# Patient Record
Sex: Male | Born: 1995
Health system: Southern US, Community
[De-identification: ages and names within clinical notes are randomized; demographics above are authoritative.]

## PROBLEM LIST (undated history)

## (undated) DIAGNOSIS — J45909 Unspecified asthma, uncomplicated: Secondary | ICD-10-CM

---

## 2016-09-08 ENCOUNTER — Encounter (HOSPITAL_COMMUNITY): Payer: Self-pay

## 2016-09-08 ENCOUNTER — Emergency Department (HOSPITAL_COMMUNITY)
Admission: EM | Admit: 2016-09-08 | Discharge: 2016-09-08 | Disposition: A | Discharge status: Home / Self Care | Payer: Self-pay | Attending: Emergency Medicine | Admitting: Emergency Medicine

## 2016-09-08 ENCOUNTER — Ambulatory Visit: Payer: Self-pay

## 2016-09-08 ENCOUNTER — Emergency Department (HOSPITAL_COMMUNITY): Payer: Self-pay

## 2016-09-08 DIAGNOSIS — J45901 Unspecified asthma with (acute) exacerbation: Secondary | ICD-10-CM | POA: Insufficient documentation

## 2016-09-08 HISTORY — DX: Unspecified asthma, uncomplicated: J45.909

## 2016-09-08 MED ORDER — ALBUTEROL SULFATE (2.5 MG/3ML) 0.083% IN NEBU
5.0000 mg | INHALATION_SOLUTION | Freq: Once | RESPIRATORY_TRACT | Status: AC
  Administered 2016-09-08: 5 mg via RESPIRATORY_TRACT
  Filled 2016-09-08: qty 6

## 2016-09-08 MED ORDER — PREDNISONE 20 MG PO TABS: 40.0000 mg | ORAL_TABLET | Freq: Every day | ORAL | 0 refills | Status: AC

## 2016-09-08 MED ORDER — ALBUTEROL SULFATE HFA 108 (90 BASE) MCG/ACT IN AERS: 1.0000 | INHALATION_SPRAY | Freq: Four times a day (QID) | RESPIRATORY_TRACT | 0 refills | Status: DC | PRN

## 2016-09-08 NOTE — ED Provider Notes (Signed)
[facility]-EMERGENCY DEPT Provider Note   CSN: [id number] Arrival date & time: 09/08/16  1614     History   Chief Complaint Chief Complaint  Patient presents with  . Shortness of Breath    HPI Jeffrey Cowan is a 20 y.o. male.  HPI reports a history of asthma and comes in for evaluation of an acute asthma exacerbation. Patient reports at approximately 2:00 this afternoon he experienced an asthma exacerbation, but was out of his albuterol inhaler. He came to the emergency Department where he received a breathing treatment. He reports his symptoms have resolved at this time. He denies any discomfort whatsoever. No fevers, chills, cough, chest pain, shortness of breath, leg swelling, nausea or vomiting, abdominal pain. Denies any other allergies. No illicit drug use. No other modifying factors.  Past Medical History:  Diagnosis Date  . Asthma     There are no active problems to display for this patient.   History reviewed. No pertinent surgical history.  OB History    No data available       Home Medications    Prior to Admission medications   Medication Sig Start Date End Date Taking? Authorizing Provider  albuterol (PROVENTIL HFA;VENTOLIN HFA) 108 (90 Base) MCG/ACT inhaler Inhale 1-2 puffs into the lungs every 6 (six) hours as needed for wheezing or shortness of breath. 09/08/16   Joycie Peek, PA-C  predniSONE (DELTASONE) 20 MG tablet Take 2 tablets (40 mg total) by mouth daily. 09/08/16   Joycie Peek, PA-C    Family History History reviewed. No pertinent family history.  Social History Social History  Substance Use Topics  . Smoking status: Never Smoker  . Smokeless tobacco: Never Used  . Alcohol use Not on file     Allergies   Review of patient's allergies indicates no known allergies.   Review of Systems Review of Systems A 10 point review of systems was completed and was negative except for pertinent positives and negatives as mentioned in the  history of present illness    Physical Exam Updated Vital Signs BP 100/86 (BP Location: Left Arm)   Pulse 105   Temp 98.4 F (36.9 C) (Oral)   Resp 20   SpO2 94%   Physical Exam  Constitutional: He appears well-developed. No distress.  Awake, alert and nontoxic in appearance  HENT:  Head: Normocephalic and atraumatic.  Right Ear: External ear normal.  Left Ear: External ear normal.  Mouth/Throat: Oropharynx is clear and moist.  Eyes: Conjunctivae and EOM are normal. Pupils are equal, round, and reactive to light.  Neck: Normal range of motion. No JVD present.  Cardiovascular: Normal rate, regular rhythm and normal heart sounds.   Pulmonary/Chest: Effort normal and breath sounds normal. No stridor. No respiratory distress. He has no wheezes. He has no rales.  Abdominal: Soft. There is no tenderness.  Musculoskeletal: Normal range of motion.  Neurological:  Awake, alert, cooperative and aware of situation; motor strength bilaterally; sensation normal to light touch bilaterally; no facial asymmetry; tongue midline; major cranial nerves appear intact;  baseline gait without new ataxia.  Skin: No rash noted. He is not diaphoretic.  Psychiatric: He has a normal mood and affect. His behavior is normal. Thought content normal.  Nursing note and vitals reviewed.    ED Treatments / Results  Labs (all labs ordered are listed, but only abnormal results are displayed) Labs Reviewed - No data to display  EKG  EKG Interpretation  Date/Time:  Wednesday September 08 2016 16:43:37 EDT  Ventricular Rate:  102 PR Interval:    QRS Duration: 83 QT Interval:  313 QTC Calculation: 408 R Axis:   86 Text Interpretation:  Sinus tachycardia Ventricular premature complex Biatrial enlargement ST-t wave abnormality Artifact Abnormal ekg Confirmed by Gerhard MunchLOCKWOOD, ROBERT  MD [phone]([id number]) on 09/08/2016 5:18:21 PM       Radiology Dg Chest 2 View  Result Date: 09/08/2016 CLINICAL DATA:  Shortness of breath  since this morning. EXAM: CHEST  2 VIEW COMPARISON:  None. FINDINGS: The cardiac silhouette, mediastinal and hilar contours are normal. The lungs demonstrate mild hyperinflation but no infiltrates, edema or effusions. The bony thorax is intact. IMPRESSION: Hyperinflation but no infiltrates or effusions. Electronically Signed   By: Rudie MeyerP.  Gallerani M.D.   On: 09/08/2016 17:25    Procedures Procedures (including critical care time)  Medications Ordered in ED Medications  albuterol (PROVENTIL) (2.5 MG/3ML) 0.083% nebulizer solution 5 mg (5 mg Nebulization Given 09/08/16 1642)     Initial Impression / Assessment and Plan / ED Course  I have reviewed the triage vital signs and the nursing notes.  Pertinent labs & imaging results that were available during my care of the patient were reviewed by me and considered in my medical decision making (see chart for details).  Clinical Course      Asthma exacerbation resolved with one breathing treatment in emergency department. Overall he appears very well, nontoxic, hemodynamically stable. We'll discharge with short course steroid burst. Refill albuterol inhaler. Follow up with PCP as needed. Return precautions discussed.  Prior to patient discharge, I discussed and reviewed this case with Dr. Jeraldine LootsLockwood    Final Clinical Impressions(s) / ED Diagnoses   Final diagnoses:  Mild asthma with exacerbation, unspecified whether persistent    New Prescriptions New Prescriptions   ALBUTEROL (PROVENTIL HFA;VENTOLIN HFA) 108 (90 BASE) MCG/ACT INHALER    Inhale 1-2 puffs into the lungs every 6 (six) hours as needed for wheezing or shortness of breath.   PREDNISONE (DELTASONE) 20 MG TABLET    Take 2 tablets (40 mg total) by mouth daily.     Joycie PeekBenjamin Mariangel Ringley, PA-C 09/08/16 1733    Gerhard Munchobert Lockwood, MD 09/08/16 2251

## 2016-09-08 NOTE — ED Triage Notes (Signed)
Pt states that he began to feel short of breath this morning. Pt tried to use his inhaler at home without relief. A&Ox4. No distress noted.

## 2016-09-08 NOTE — ED Notes (Signed)
PT DISCHARGED. INSTRUCTIONS AND PRESCRIPTIONS GIVEN. AAOX4. PT IN NO APPARENT DISTRESS OR PAIN. THE OPPORTUNITY TO ASK QUESTIONS WAS PROVIDED. 

## 2016-09-08 NOTE — Discharge Instructions (Signed)
Please take your medications as prescribed. Follow-up with your doctor as needed. Use your albuterol inhaler for any new asthma exacerbation. Return to ED for new or worsening symptoms as we discussed.

## 2017-09-12 IMAGING — CR DG CHEST 2V
2 series · 2 of 2 positions shown · non-contrast
Comparison: None.

CLINICAL DATA: Shortness of breath since this morning.

EXAM:
CHEST  2 VIEW

[w chest pa]
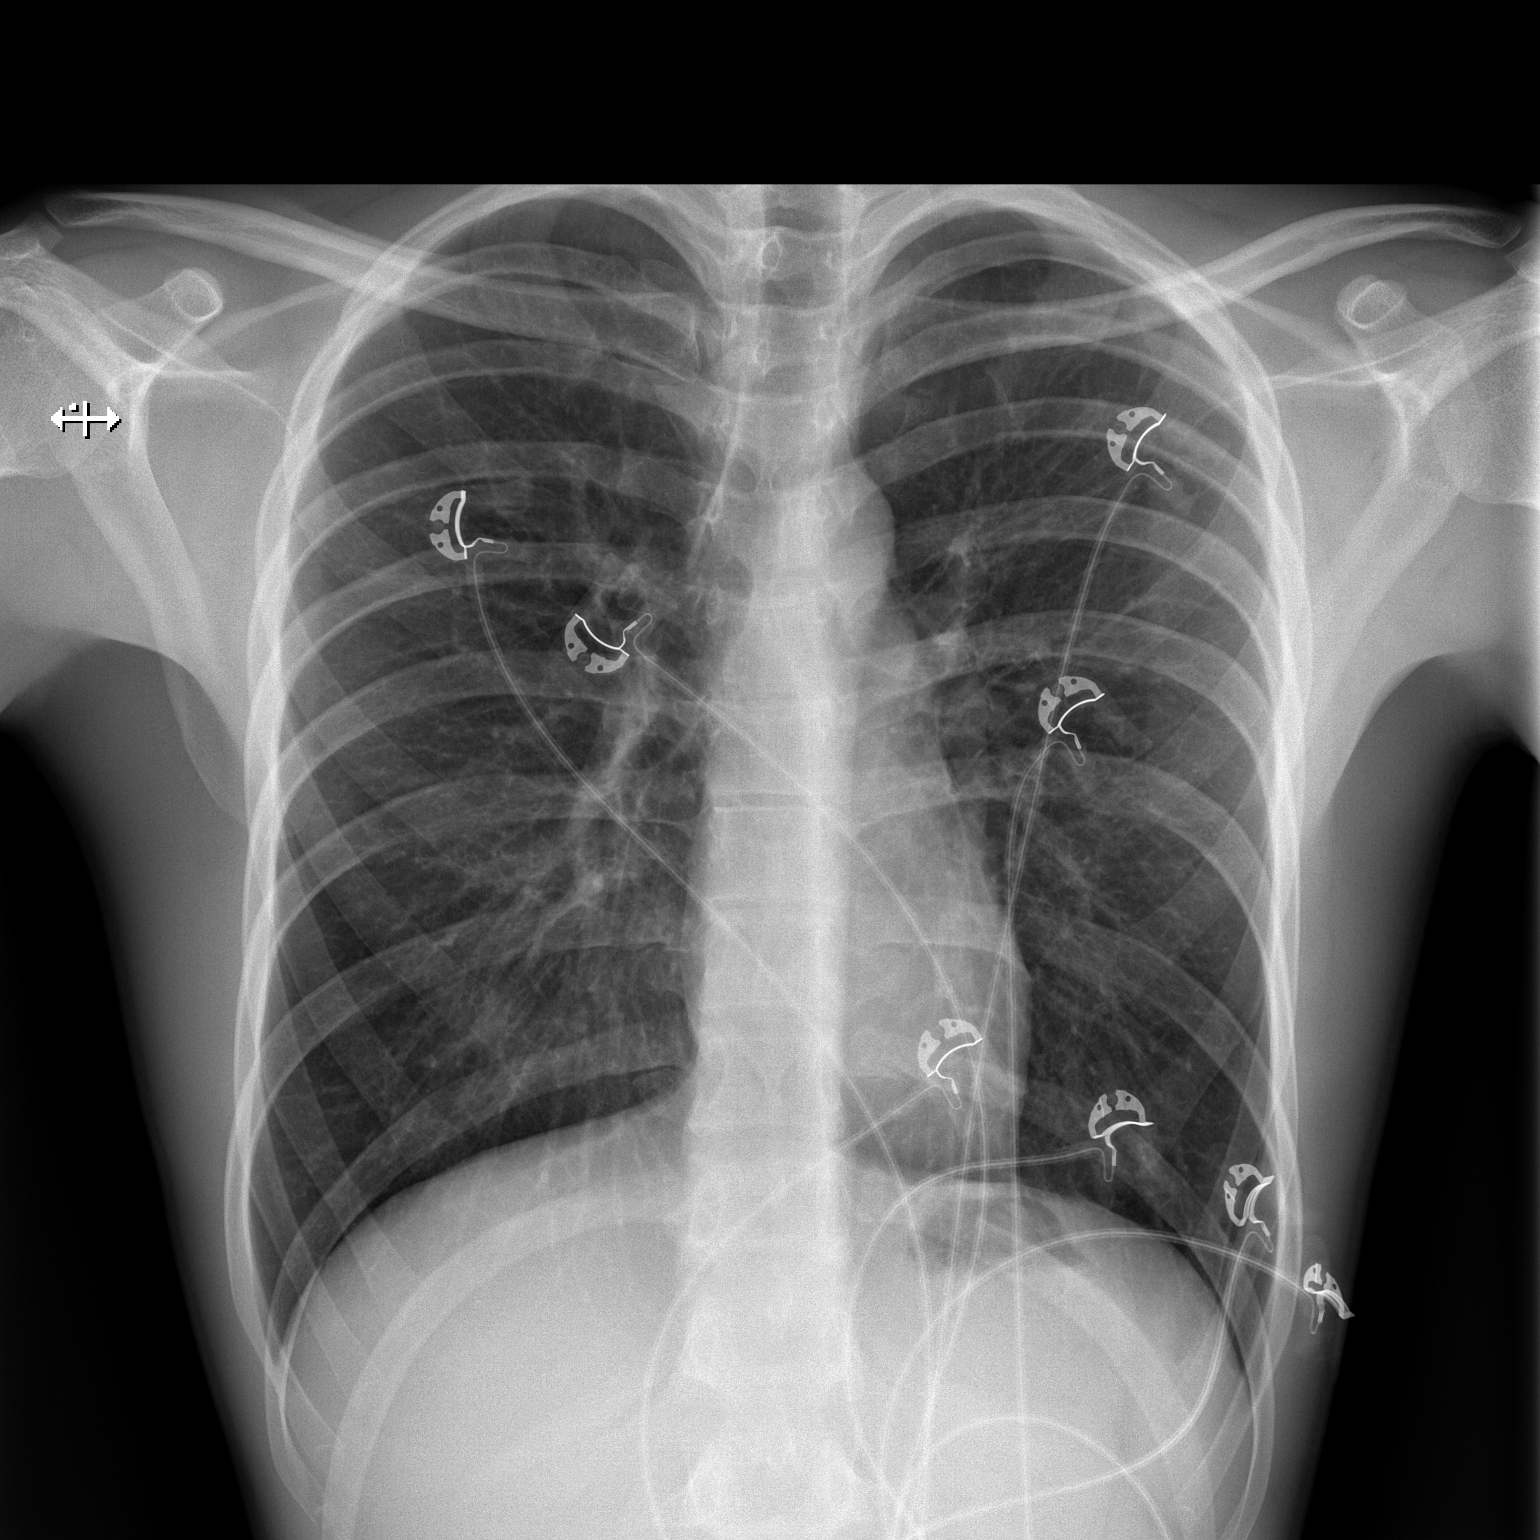

[w chest lat]
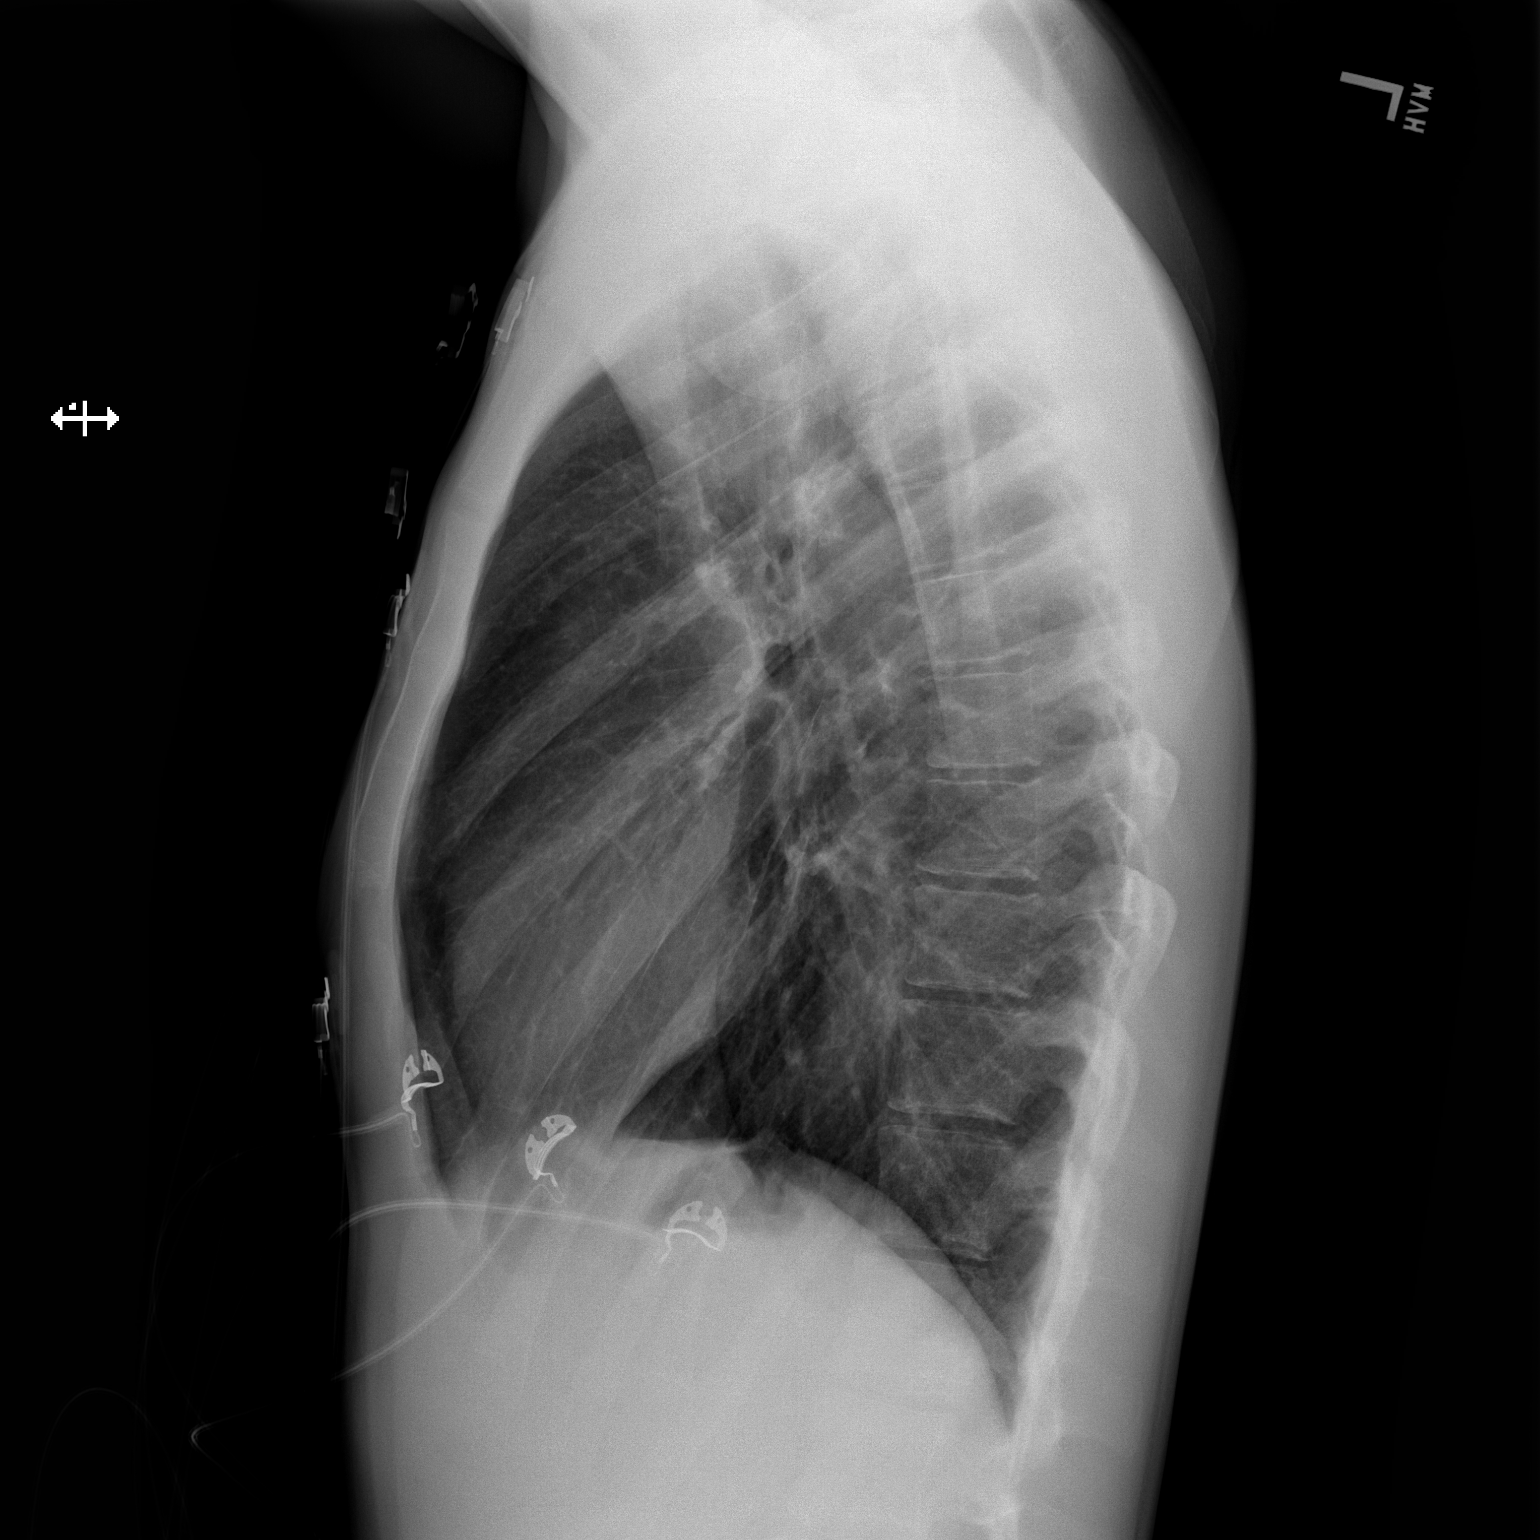

[2 of 2 positions shown; findings below may reference images not displayed]

FINDINGS: The cardiac silhouette, mediastinal and hilar contours are normal.
The lungs demonstrate mild hyperinflation but no infiltrates, edema
or effusions. The bony thorax is intact.
IMPRESSION: Hyperinflation but no infiltrates or effusions.

## 2018-03-12 ENCOUNTER — Encounter (HOSPITAL_BASED_OUTPATIENT_CLINIC_OR_DEPARTMENT_OTHER): Payer: Self-pay

## 2018-03-12 ENCOUNTER — Other Ambulatory Visit: Payer: Self-pay

## 2018-03-12 ENCOUNTER — Emergency Department (HOSPITAL_BASED_OUTPATIENT_CLINIC_OR_DEPARTMENT_OTHER)
Admission: EM | Admit: 2018-03-12 | Discharge: 2018-03-12 | Disposition: A | Discharge status: Home / Self Care | Payer: PPO | Attending: Emergency Medicine | Admitting: Emergency Medicine

## 2018-03-12 DIAGNOSIS — J4521 Mild intermittent asthma with (acute) exacerbation: Secondary | ICD-10-CM | POA: Insufficient documentation

## 2018-03-12 DIAGNOSIS — R0602 Shortness of breath: Secondary | ICD-10-CM | POA: Diagnosis present

## 2018-03-12 DIAGNOSIS — J45909 Unspecified asthma, uncomplicated: Secondary | ICD-10-CM | POA: Insufficient documentation

## 2018-03-12 DIAGNOSIS — Z79899 Other long term (current) drug therapy: Secondary | ICD-10-CM | POA: Insufficient documentation

## 2018-03-12 MED ORDER — ALBUTEROL SULFATE HFA 108 (90 BASE) MCG/ACT IN AERS
2.0000 | INHALATION_SPRAY | Freq: Once | RESPIRATORY_TRACT | Status: AC
  Administered 2018-03-12: 2 via RESPIRATORY_TRACT
  Filled 2018-03-12: qty 6.7

## 2018-03-12 MED ORDER — ALBUTEROL SULFATE HFA 108 (90 BASE) MCG/ACT IN AERS: 2.0000 | INHALATION_SPRAY | Freq: Once | RESPIRATORY_TRACT | Status: DC

## 2018-03-12 MED ORDER — ALBUTEROL SULFATE (2.5 MG/3ML) 0.083% IN NEBU
2.5000 mg | INHALATION_SOLUTION | Freq: Once | RESPIRATORY_TRACT | Status: AC
  Administered 2018-03-12: 2.5 mg via RESPIRATORY_TRACT
  Filled 2018-03-12: qty 3

## 2018-03-12 MED ORDER — IPRATROPIUM-ALBUTEROL 0.5-2.5 (3) MG/3ML IN SOLN
3.0000 mL | Freq: Once | RESPIRATORY_TRACT | Status: AC
  Administered 2018-03-12: 3 mL via RESPIRATORY_TRACT
  Filled 2018-03-12: qty 3

## 2018-03-12 MED ORDER — ALBUTEROL SULFATE HFA 108 (90 BASE) MCG/ACT IN AERS: 1.0000 | INHALATION_SPRAY | Freq: Four times a day (QID) | RESPIRATORY_TRACT | 0 refills | Status: AC | PRN

## 2018-03-12 NOTE — ED Triage Notes (Signed)
Pt reports ShOB x 2 days. Pt has hx of asthma. Pt reports he does not have a Rx for an inhaler.

## 2018-03-12 NOTE — Discharge Instructions (Addendum)
Albuterol inhaler: 2 puffs every 4 hours as needed for wheezing.  Return to the emergency department if symptoms significantly worsen or change.

## 2018-03-12 NOTE — ED Provider Notes (Signed)
[facility] EMERGENCY DEPARTMENT Provider Note   CSN: [id number] Arrival date & time: 03/12/18  0059     History   Chief Complaint Chief Complaint  Patient presents with  . Shortness of Breath    HPI Paiton Fosco is a 22 y.o. male.  This patient is a 22 year old male with past medical history of asthma.  He presents for evaluation of wheezing and difficulty breathing.  This is been ongoing for the past 2 days.  He denies any fevers, chills, or productive cough.  He denies any chest pain.  The history is provided by the patient.  Shortness of Breath  This is a recurrent problem. The problem occurs intermittently.The current episode started 2 days ago. The problem has been gradually worsening. Associated symptoms include cough. Pertinent negatives include no fever, no sputum production, no chest pain, no leg pain and no leg swelling. He has tried nothing for the symptoms. The treatment provided no relief.    Past Medical History:  Diagnosis Date  . Asthma     There are no active problems to display for this patient.   History reviewed. No pertinent surgical history.      Home Medications    Prior to Admission medications   Medication Sig Start Date End Date Taking? Authorizing Provider  albuterol (PROVENTIL HFA;VENTOLIN HFA) 108 (90 Base) MCG/ACT inhaler Inhale 1-2 puffs into the lungs every 6 (six) hours as needed for wheezing or shortness of breath. 09/08/16   Cartner, Sharlet Salina, PA-C  predniSONE (DELTASONE) 20 MG tablet Take 2 tablets (40 mg total) by mouth daily. 09/08/16   Joycie Peek, PA-C    Family History No family history on file.  Social History Social History   Tobacco Use  . Smoking status: Never Smoker  . Smokeless tobacco: Never Used  Substance Use Topics  . Alcohol use: Never    Frequency: Never  . Drug use: Never     Allergies   Patient has no known allergies.   Review of Systems Review of Systems  Constitutional:  Negative for fever.  Respiratory: Positive for cough and shortness of breath. Negative for sputum production.   Cardiovascular: Negative for chest pain and leg swelling.  All other systems reviewed and are negative.    Physical Exam Updated Vital Signs BP 110/80 (BP Location: Right Arm)   Pulse 68   Temp 98.3 F (36.8 C) (Oral)   Resp 20   Ht 5\' 10"  (1.778 m)   Wt 62 kg (136 lb 11 oz)   SpO2 98%   BMI 19.61 kg/m   Physical Exam  Constitutional: He is oriented to person, place, and time. He appears well-developed and well-nourished. No distress.  HENT:  Head: Normocephalic and atraumatic.  Mouth/Throat: Oropharynx is clear and moist.  Neck: Normal range of motion. Neck supple.  Cardiovascular: Normal rate and regular rhythm. Exam reveals no friction rub.  No murmur heard. Pulmonary/Chest: Effort normal and breath sounds normal. No respiratory distress. He has no wheezes. He has no rales.  Abdominal: Soft. Bowel sounds are normal. He exhibits no distension. There is no tenderness.  Musculoskeletal: Normal range of motion. He exhibits no edema.  Neurological: He is alert and oriented to person, place, and time. Coordination normal.  Skin: Skin is warm and dry. He is not diaphoretic.  Nursing note and vitals reviewed.    ED Treatments / Results  Labs (all labs ordered are listed, but only abnormal results are displayed) Labs Reviewed - No data to  display  EKG None  Radiology No results found.  Procedures Procedures (including critical care time)  Medications Ordered in ED Medications  albuterol (PROVENTIL HFA;VENTOLIN HFA) 108 (90 Base) MCG/ACT inhaler 2 puff (2 puffs Inhalation Given 03/12/18 0110)  ipratropium-albuterol (DUONEB) 0.5-2.5 (3) MG/3ML nebulizer solution 3 mL (3 mLs Nebulization Given 03/12/18 0110)  albuterol (PROVENTIL) (2.5 MG/3ML) 0.083% nebulizer solution 2.5 mg (2.5 mg Nebulization Given 03/12/18 0110)     Initial Impression / Assessment and  Plan / ED Course  I have reviewed the triage vital signs and the nursing notes.  Pertinent labs & imaging results that were available during my care of the patient were reviewed by me and considered in my medical decision making (see chart for details).  Patient given an albuterol nebulizer treatment as well as albuterol MDI.  He will be discharged with the albuterol inhaler and advised to continue this as needed.  His oxygen saturations are 99% while I am examining him and his lungs are clear after the treatment.  Final Clinical Impressions(s) / ED Diagnoses   Final diagnoses:  None    ED Discharge Orders    None       Geoffery Lyonselo, Yasha Tibbett, MD 03/12/18 [phone]

## 2018-05-03 ENCOUNTER — Other Ambulatory Visit: Payer: Self-pay

## 2018-05-03 ENCOUNTER — Emergency Department (HOSPITAL_BASED_OUTPATIENT_CLINIC_OR_DEPARTMENT_OTHER)
Admission: EM | Admit: 2018-05-03 | Discharge: 2018-05-03 | Disposition: A | Discharge status: Home / Self Care | Payer: PPO | Attending: Emergency Medicine | Admitting: Emergency Medicine

## 2018-05-03 ENCOUNTER — Encounter (HOSPITAL_BASED_OUTPATIENT_CLINIC_OR_DEPARTMENT_OTHER): Payer: Self-pay

## 2018-05-03 DIAGNOSIS — J45909 Unspecified asthma, uncomplicated: Secondary | ICD-10-CM | POA: Diagnosis not present

## 2018-05-03 DIAGNOSIS — K6289 Other specified diseases of anus and rectum: Secondary | ICD-10-CM | POA: Diagnosis present

## 2018-05-03 DIAGNOSIS — K644 Residual hemorrhoidal skin tags: Secondary | ICD-10-CM | POA: Insufficient documentation

## 2018-05-03 MED ORDER — HYDROCORTISONE 2.5 % RE CREA: TOPICAL_CREAM | RECTAL | 0 refills | Status: AC

## 2018-05-03 MED FILL — PROCTOZONE-HC 2.5 % CREA: 2.5 | 10 days supply | Qty: 30 | Fill #0

## 2018-05-03 NOTE — ED Provider Notes (Signed)
[facility] EMERGENCY DEPARTMENT Provider Note   CSN: [id number] Arrival date & time: 05/03/18  1040     History   Chief Complaint Chief Complaint  Patient presents with  . Recurrent Skin Infections    HPI Jeffrey Cowan is a 22 y.o. male.  The history is provided by the patient and medical records. No language interpreter was used.   Jeffrey Cowan is a 22 y.o. male  with a PMH of asthma who presents to the Emergency Department complaining of raised area to the rectum which he noticed today.  Patient reports that the area is causing him no pain, he felt the area when wiping today after having large bowel movement.  He does report having some constipation lately, but not enough so that it was causing him discomfort or felt he needed to seek care.  No medications taken prior to arrival for symptoms.  No history of similar.  No fever, chills, abdominal pain, nausea, vomiting or rectal pain.  Past Medical History:  Diagnosis Date  . Asthma     There are no active problems to display for this patient.   History reviewed. No pertinent surgical history.      Home Medications    Prior to Admission medications   Medication Sig Start Date End Date Taking? Authorizing Provider  albuterol (PROVENTIL HFA;VENTOLIN HFA) 108 (90 Base) MCG/ACT inhaler Inhale 1-2 puffs into the lungs every 6 (six) hours as needed for wheezing or shortness of breath. 03/12/18   Geoffery Lyons, MD  hydrocortisone (ANUSOL-HC) 2.5 % rectal cream Apply rectally 2 times daily until symptoms resolve 05/03/18   Ward, Chase Picket, PA-C  predniSONE (DELTASONE) 20 MG tablet Take 2 tablets (40 mg total) by mouth daily. 09/08/16   Joycie Peek, PA-C    Family History No family history on file.  Social History Social History   Tobacco Use  . Smoking status: Never Smoker  . Smokeless tobacco: Never Used  Substance Use Topics  . Alcohol use: Never    Frequency: Never  . Drug use: Never      Allergies   Patient has no known allergies.   Review of Systems Review of Systems  Constitutional: Negative for chills and fever.  Gastrointestinal: Positive for constipation. Negative for abdominal pain, blood in stool, diarrhea, nausea, rectal pain and vomiting.  Genitourinary: Negative for dysuria.  Musculoskeletal: Negative for arthralgias and myalgias.  Skin: Negative for wound.     Physical Exam Updated Vital Signs BP (!) 112/54 (BP Location: Right Arm)   Pulse (!) 53   Temp 98.3 F (36.8 C) (Oral)   Resp 18   SpO2 100%   Physical Exam  Constitutional: He appears well-developed and well-nourished. No distress.  HENT:  Head: Normocephalic and atraumatic.  Neck: Neck supple.  Cardiovascular: Normal rate, regular rhythm and normal heart sounds.  No murmur heard. Pulmonary/Chest: Effort normal and breath sounds normal. No respiratory distress. He has no wheezes. He has no rales.  Genitourinary:  Genitourinary Comments: Non-tender external hemorrhoid with fleshy color on rectal exam.   Musculoskeletal: Normal range of motion.  Neurological: He is alert.  Skin: Skin is warm and dry.  Nursing note and vitals reviewed.    ED Treatments / Results  Labs (all labs ordered are listed, but only abnormal results are displayed) Labs Reviewed - No data to display  EKG None  Radiology No results found.  Procedures Procedures (including critical care time)  Medications Ordered in ED Medications - No data  to display   Initial Impression / Assessment and Plan / ED Course  I have reviewed the triage vital signs and the nursing notes.  Pertinent labs & imaging results that were available during my care of the patient were reviewed by me and considered in my medical decision making (see chart for details).      Jeffrey Cowan is a 22 y.o. male who presents to ED after noticing raised area to the rectum today.  Exam consistent with external hemorrhoid.  No  signs of thrombosis.  No rectal pain and area is non-tender. No signs of abscess.  Will treat with steroid cream and symptomatic care for constipation which she has been having over the last several days.  PCP follow-up if no improvement.  Reasons to return to ER discussed and all questions answered.   Final Clinical Impressions(s) / ED Diagnoses   Final diagnoses:  External hemorrhoid    ED Discharge Orders        Ordered    hydrocortisone (ANUSOL-HC) 2.5 % rectal cream     05/03/18 1114       Ward, Chase PicketJaime Pilcher, PA-C 05/03/18 1121    Tilden Fossaees, Elizabeth, MD 05/03/18 1551

## 2018-05-03 NOTE — Discharge Instructions (Signed)
It was my pleasure taking care of you today!   Increase hydration and follow a high fiber diet. Use a stool softener such as Colace to help relieve pain with bowel movements. Sitz baths and Epsom salt baths will also help improve symptoms. Apply topical hydrocortisone cream twice daily.   If symptoms persist longer than 1-2 weeks, please follow up with your primary care provider. Please return to ER for new or worsening symptoms, any additional concerns.

## 2018-05-03 NOTE — ED Triage Notes (Signed)
Pt c/o a raised area on his rectum, no pain, no redness
